# Patient Record
Sex: Male | Born: 1980 | Race: White | Hispanic: No | Marital: Married
Health system: Southern US, Community
[De-identification: ages and names within clinical notes are randomized; demographics above are authoritative.]

---

## 2007-09-20 ENCOUNTER — Encounter (HOSPITAL_COMMUNITY): Admission: RE | Admit: 2007-09-20 | Discharge: 2007-09-24 | Payer: Self-pay | Admitting: Unknown Physician Specialty

## 2007-10-06 ENCOUNTER — Inpatient Hospital Stay (HOSPITAL_COMMUNITY): Admission: AD | Admit: 2007-10-06 | Discharge: 2007-10-10 | Payer: Self-pay | Admitting: Cardiology

## 2007-10-15 ENCOUNTER — Encounter (HOSPITAL_COMMUNITY): Admission: RE | Admit: 2007-10-15 | Discharge: 2007-12-24 | Payer: Self-pay | Admitting: Unknown Physician Specialty

## 2010-06-02 ENCOUNTER — Encounter: Payer: Self-pay | Admitting: Unknown Physician Specialty

## 2014-01-09 ENCOUNTER — Other Ambulatory Visit (HOSPITAL_COMMUNITY): Payer: Self-pay | Admitting: Endocrinology

## 2014-01-09 DIAGNOSIS — C73 Malignant neoplasm of thyroid gland: Secondary | ICD-10-CM

## 2014-01-10 ENCOUNTER — Encounter (HOSPITAL_COMMUNITY)
Admission: RE | Admit: 2014-01-10 | Discharge: 2014-01-10 | Disposition: A | Discharge status: Home / Self Care | Payer: BC Managed Care – PPO | Source: Ambulatory Visit | Attending: Endocrinology | Admitting: Endocrinology

## 2014-01-10 DIAGNOSIS — C73 Malignant neoplasm of thyroid gland: Secondary | ICD-10-CM | POA: Insufficient documentation

## 2014-01-10 MED ORDER — SODIUM IODIDE I 131 CAPSULE
3.0000 | Freq: Once | INTRAVENOUS | Status: AC | PRN
  Administered 2014-01-10: 3 via ORAL

## 2014-01-13 ENCOUNTER — Encounter (HOSPITAL_COMMUNITY)
Admission: RE | Admit: 2014-01-13 | Discharge: 2014-01-13 | Disposition: A | Discharge status: Home / Self Care | Payer: BC Managed Care – PPO | Source: Ambulatory Visit | Attending: Endocrinology | Admitting: Endocrinology

## 2014-01-13 DIAGNOSIS — C73 Malignant neoplasm of thyroid gland: Secondary | ICD-10-CM | POA: Diagnosis present

## 2014-09-04 IMAGING — NM NM [ID] THYROID CANCER METS WHOLE BODY
6 series · 6 of 6 positions shown · non-contrast
Comparison: 10/15/2007

CLINICAL DATA: Followup thyroid cancer.

EXAM:
NUCLEAR MEDICINE 3-2T2 WHOLE BODY SCAN
TECHNIQUE: The patient received 3.0 mCi 3-2T2 sodium iodide on 01/10/2014. The
patient returns today, and whole body scanning was performed in the
anterior and posterior projections.

[Series 1: marker · 4.14mm/px · 1 of 1 slices shown (1 of 2)]
[im 1/1]
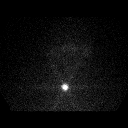

[Series 1: marker · 4.14mm/px · 1 of 1 slices shown (2 of 2)]
[im 1/1  full-range]
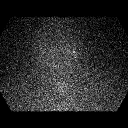

[Series 2: static thyroid no marker · 4.14mm/px · 1 of 1 slices shown (1 of 2)]
[im 1/1  full-range]
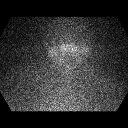

[Series 2: static thyroid no marker · 4.14mm/px · 1 of 1 slices shown (2 of 2)]
[im 1/1  full-range]
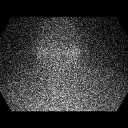

[Series 3: i131 whole body · 2.66mm/px · 1 of 1 slices shown (1 of 2)]
[im 1/1  full-range]
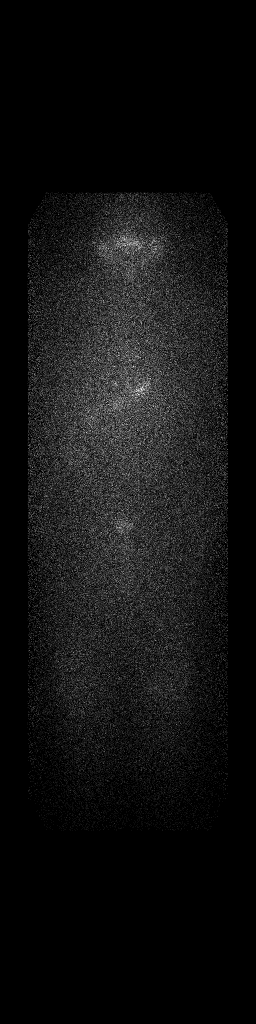

[Series 3: i131 whole body · 2.66mm/px · 1 of 1 slices shown (2 of 2)]
[im 1/1  full-range]
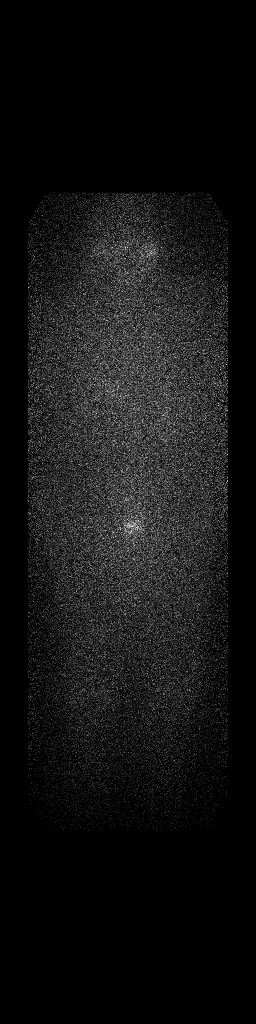

[6 of 6 positions shown; findings below may reference images not displayed]

FINDINGS: No abnormal focus of increased radiotracer uptake identified to
suggest residual functioning thyroid tissue within the thyroid bed.
There is no evidence for metastatic disease. Normal activity is seen
within the stomach an urinary bladder.
IMPRESSION: 1. No evidence for residual or recurrent functioning thyroid tissue.
No metastatic disease.

## 2017-04-30 ENCOUNTER — Other Ambulatory Visit: Payer: Self-pay | Admitting: Internal Medicine

## 2017-04-30 DIAGNOSIS — Z9009 Acquired absence of other part of head and neck: Secondary | ICD-10-CM

## 2017-04-30 DIAGNOSIS — E890 Postprocedural hypothyroidism: Secondary | ICD-10-CM

## 2017-06-05 ENCOUNTER — Other Ambulatory Visit: Payer: Self-pay

## 2017-06-10 ENCOUNTER — Ambulatory Visit
Admission: RE | Admit: 2017-06-10 | Discharge: 2017-06-10 | Disposition: A | Discharge status: Home / Self Care | Payer: BLUE CROSS/BLUE SHIELD | Source: Ambulatory Visit | Attending: Internal Medicine | Admitting: Internal Medicine

## 2017-06-10 DIAGNOSIS — Z9009 Acquired absence of other part of head and neck: Secondary | ICD-10-CM

## 2017-06-10 DIAGNOSIS — E890 Postprocedural hypothyroidism: Secondary | ICD-10-CM

## 2018-10-05 ENCOUNTER — Other Ambulatory Visit: Payer: Self-pay | Admitting: Urology

## 2018-10-05 ENCOUNTER — Other Ambulatory Visit: Payer: Self-pay | Admitting: Family Medicine

## 2018-10-05 DIAGNOSIS — R319 Hematuria, unspecified: Secondary | ICD-10-CM

## 2018-10-08 ENCOUNTER — Other Ambulatory Visit: Payer: BLUE CROSS/BLUE SHIELD

## 2020-01-19 IMAGING — US US THYROID
1 series · 14 of 25 positions shown · non-contrast
Comparison: 11/15/2015 and 07/28/2011 ultrasound studies at
[facility]

CLINICAL DATA: History of prior total thyroidectomy. Chronic
palpable subcutaneous nodule in the right superior posterolateral
neck

EXAM:
ULTRASOUND OF HEAD/NECK SOFT TISSUES
TECHNIQUE: Ultrasound examination of the head and neck soft tissues was
performed in the area of clinical concern.

[Series 1: us thyroid · 0.04mm/px · 29 acquisitions, 14 frames shown]
[im 1/29]
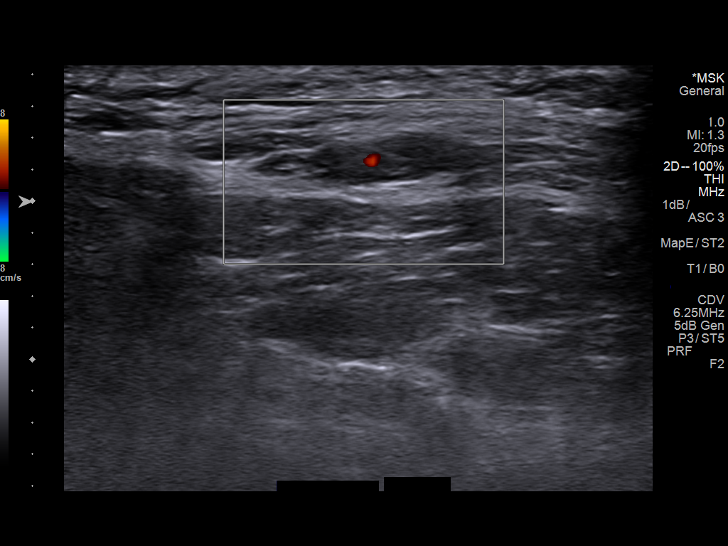
[im 3/29]
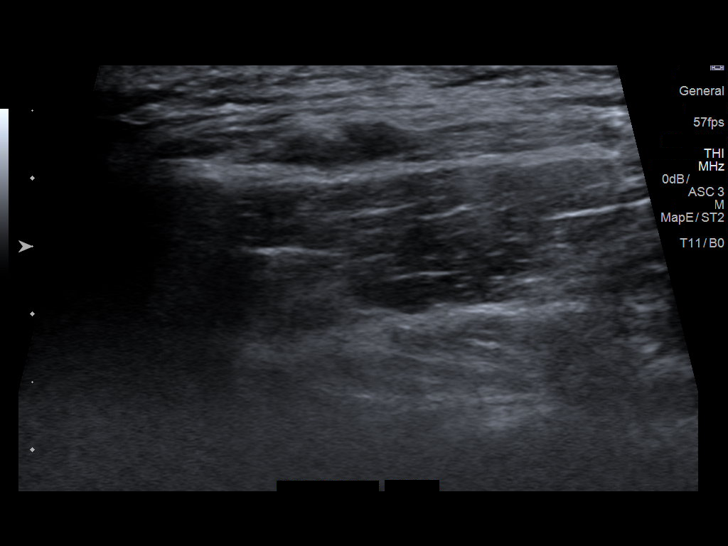
[im 5/29]
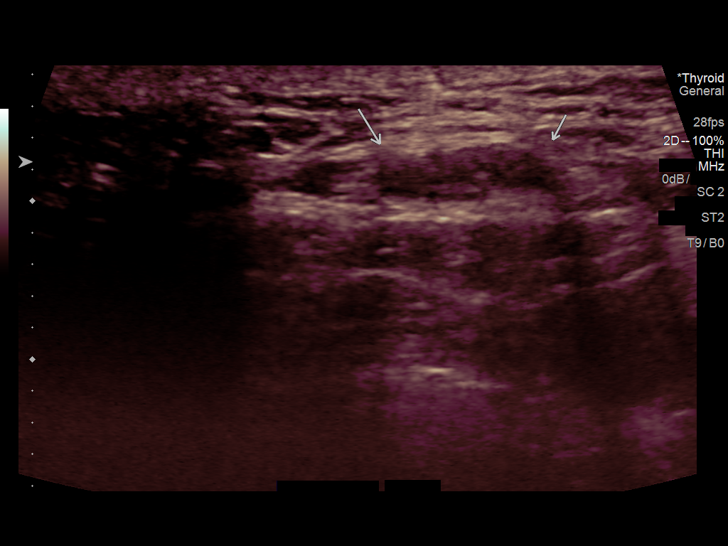
[im 8/29]
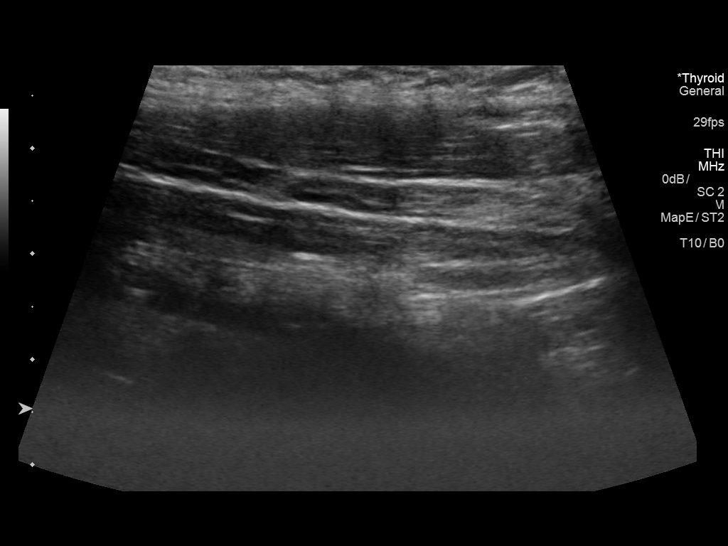
[im 10/29]
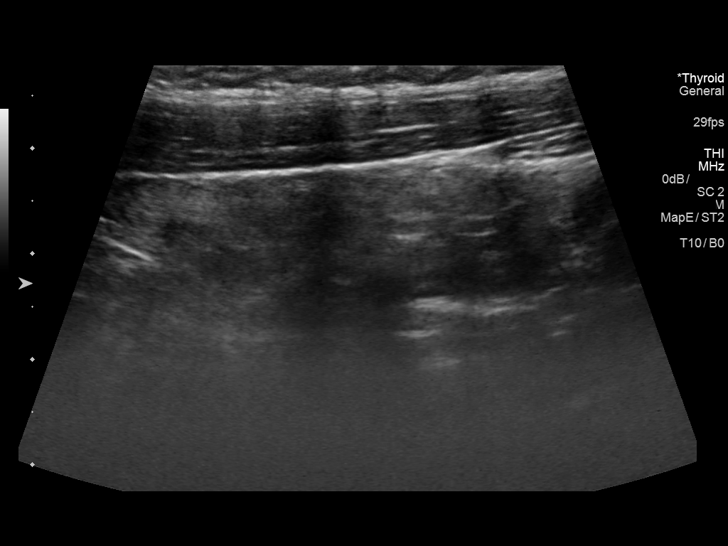
[im 11/29]
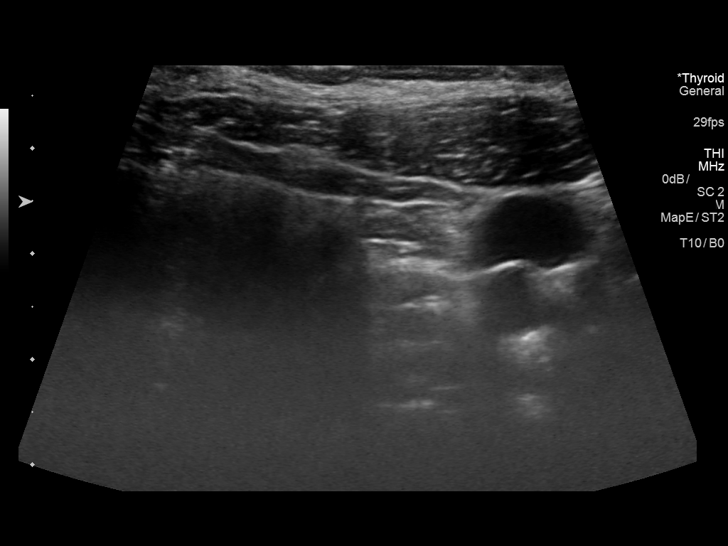
[im 13/29]
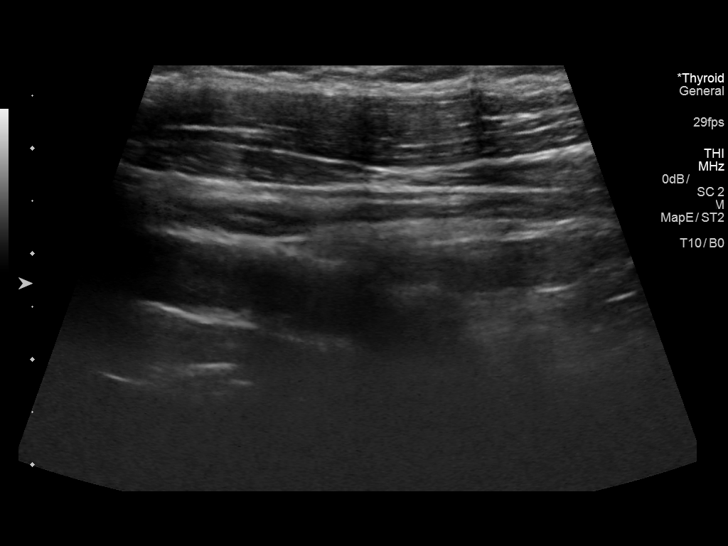
[im 16/29]
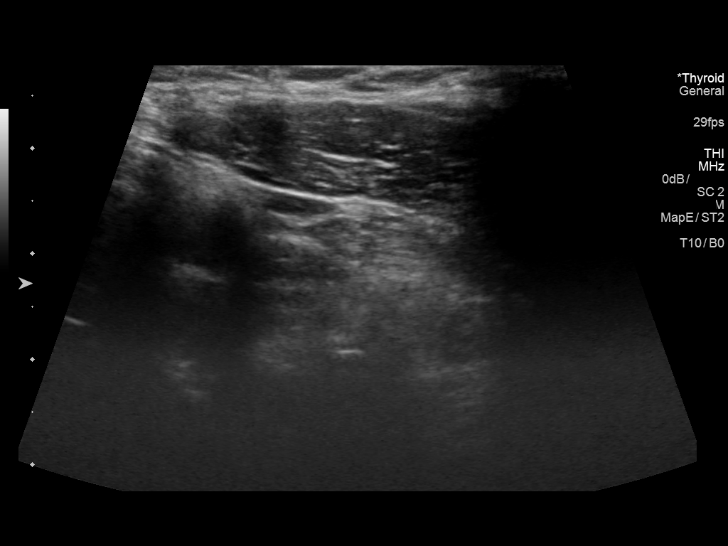
[im 18/29]
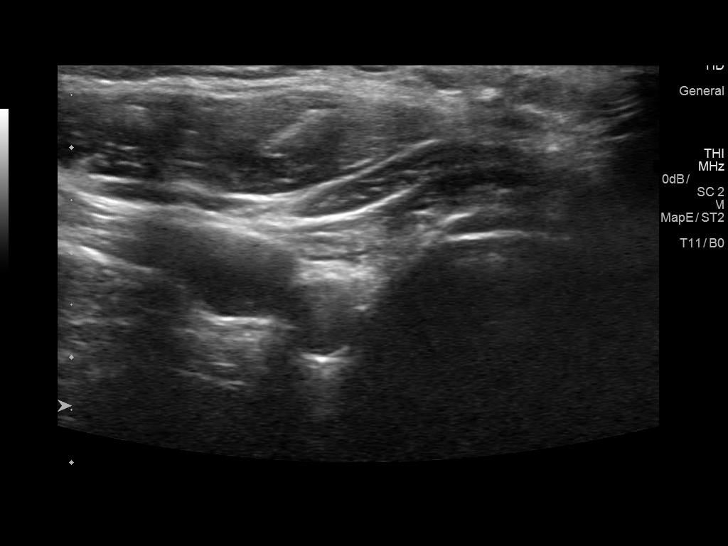
[im 19/29]
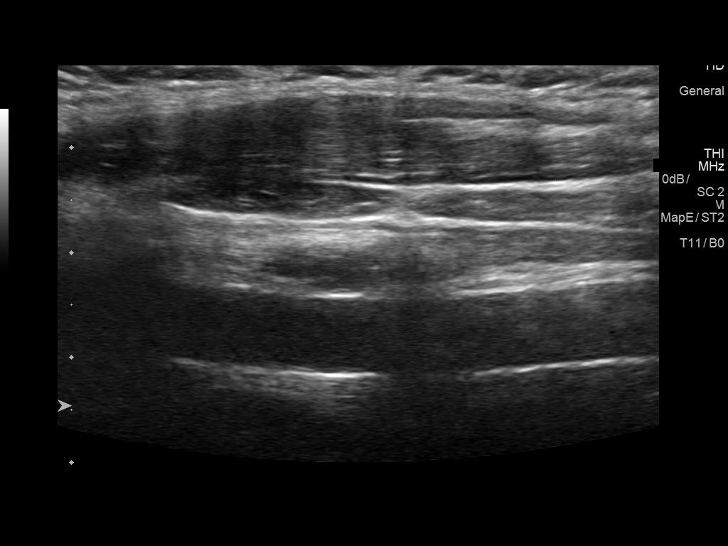
[im 22/29]
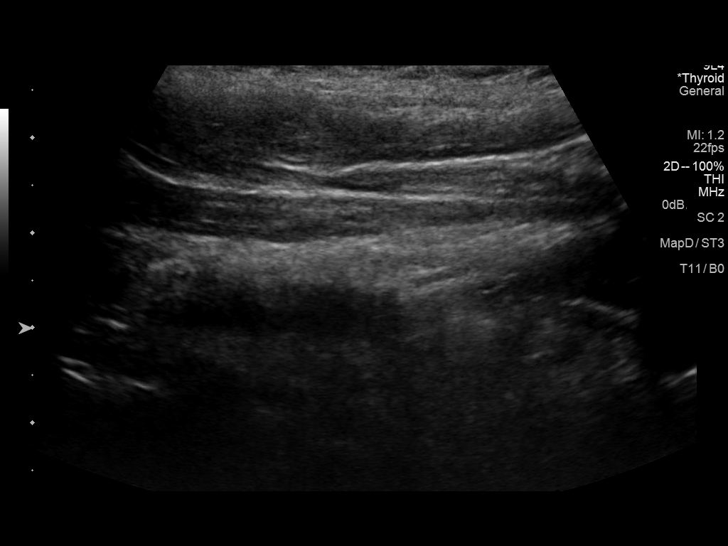
[im 24/29]
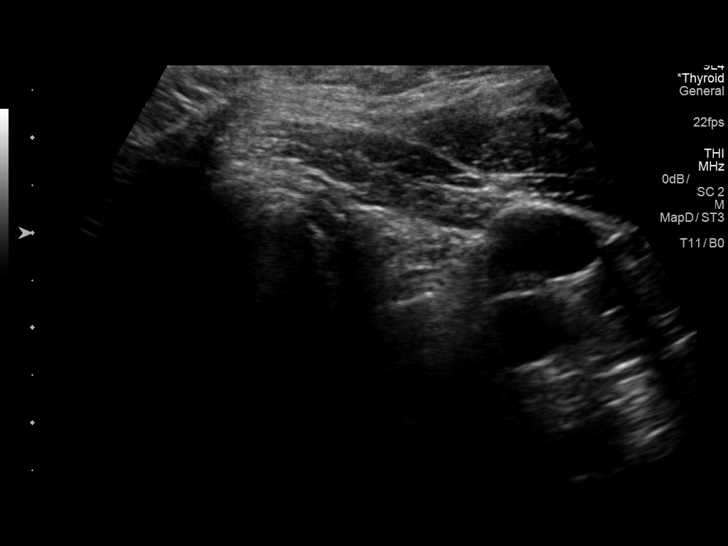
[im 26/29]
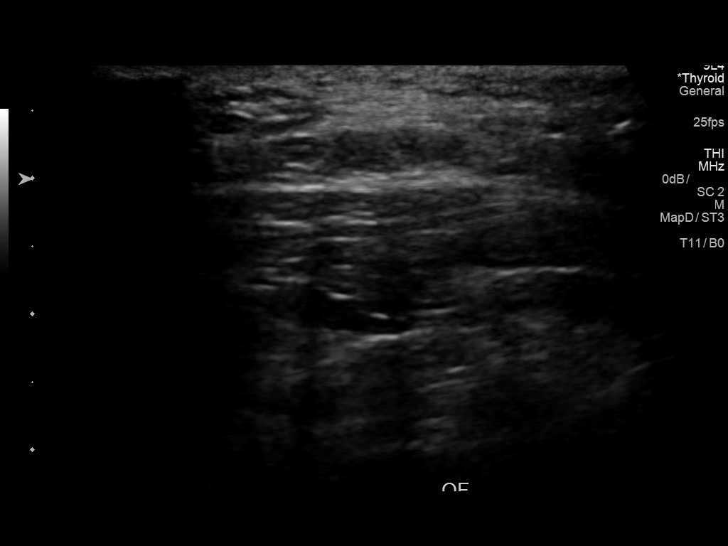
[im 29/29]
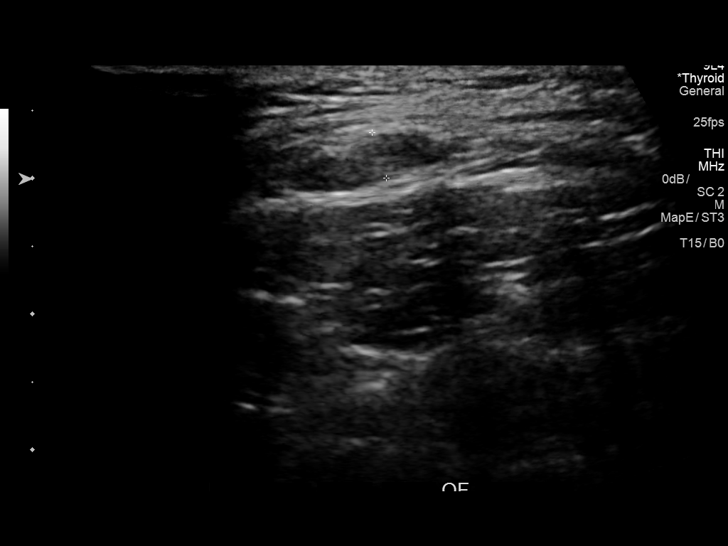

[14 of 25 positions shown; findings below may reference images not displayed]

FINDINGS: Persistent area of ovoid subcutaneous soft tissue measures
approximately 0.3 cm in short axis and 1.3 cm in greatest length.
This appears mildly more prominent compared to the ultrasound in
9570 but stable since the prior ultrasound in 5105. This again is
felt to most likely represent a small nonenlarged lymph node.
Examination in the region of prior thyroidectomy shows no residual
thyroid tissue or abnormal soft tissue at the level of prior
thyroidectomy.
IMPRESSION: Stable area of ovoid soft tissue in the right neck compared to 5105
likely representing a small lymph node.

## 2022-12-23 DIAGNOSIS — R079 Chest pain, unspecified: Secondary | ICD-10-CM

## 2022-12-24 DIAGNOSIS — R079 Chest pain, unspecified: Secondary | ICD-10-CM | POA: Diagnosis not present
